# Patient Record
Sex: Male | Born: 2000 | Hispanic: Yes | Marital: Single
Health system: Southern US, Community
[De-identification: ages and names within clinical notes are randomized; demographics above are authoritative.]

---
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
## 2022-11-06 ENCOUNTER — Encounter: Payer: Self-pay | Admitting: Pediatrics
